# Patient Record
Sex: Male | Born: 1980 | Race: White | Hispanic: No | Marital: Married | Smoking: Never smoker
Health system: Southern US, Community
[De-identification: ages and names within clinical notes are randomized; demographics above are authoritative.]

---

## 2010-07-21 ENCOUNTER — Ambulatory Visit
Admission: RE | Admit: 2010-07-21 | Discharge: 2010-07-21 | Disposition: A | Discharge status: Home / Self Care | Payer: No Typology Code available for payment source | Source: Ambulatory Visit | Attending: Emergency Medicine | Admitting: Emergency Medicine

## 2010-07-21 ENCOUNTER — Other Ambulatory Visit: Payer: Self-pay | Admitting: Emergency Medicine

## 2010-07-21 DIAGNOSIS — M79674 Pain in right toe(s): Secondary | ICD-10-CM
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
## 2014-02-25 ENCOUNTER — Encounter (HOSPITAL_BASED_OUTPATIENT_CLINIC_OR_DEPARTMENT_OTHER): Payer: Self-pay | Admitting: Emergency Medicine

## 2014-02-25 ENCOUNTER — Emergency Department (HOSPITAL_BASED_OUTPATIENT_CLINIC_OR_DEPARTMENT_OTHER)
Admission: EM | Admit: 2014-02-25 | Discharge: 2014-02-25 | Disposition: A | Discharge status: Home / Self Care | Payer: Worker's Compensation | Attending: Emergency Medicine | Admitting: Emergency Medicine

## 2014-02-25 DIAGNOSIS — S0993XA Unspecified injury of face, initial encounter: Secondary | ICD-10-CM | POA: Diagnosis present

## 2014-02-25 DIAGNOSIS — Y9389 Activity, other specified: Secondary | ICD-10-CM | POA: Diagnosis not present

## 2014-02-25 DIAGNOSIS — W01198A Fall on same level from slipping, tripping and stumbling with subsequent striking against other object, initial encounter: Secondary | ICD-10-CM | POA: Insufficient documentation

## 2014-02-25 DIAGNOSIS — S01511A Laceration without foreign body of lip, initial encounter: Secondary | ICD-10-CM

## 2014-02-25 DIAGNOSIS — Y998 Other external cause status: Secondary | ICD-10-CM | POA: Insufficient documentation

## 2014-02-25 DIAGNOSIS — S00531A Contusion of lip, initial encounter: Secondary | ICD-10-CM

## 2014-02-25 DIAGNOSIS — Y9289 Other specified places as the place of occurrence of the external cause: Secondary | ICD-10-CM | POA: Insufficient documentation
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
